# Patient Record
Sex: Female | Born: 1960 | Race: White | Hispanic: No | Marital: Married | Smoking: Never smoker
Health system: Southern US, Community
[De-identification: ages and names within clinical notes are randomized; demographics above are authoritative.]

## PROBLEM LIST (undated history)

## (undated) DIAGNOSIS — C801 Malignant (primary) neoplasm, unspecified: Secondary | ICD-10-CM

## (undated) DIAGNOSIS — R011 Cardiac murmur, unspecified: Secondary | ICD-10-CM

## (undated) HISTORY — PX: BREAST EXCISIONAL BIOPSY: SUR124

## (undated) HISTORY — PX: DENTAL SURGERY: SHX609

## (undated) HISTORY — PX: SKIN CANCER EXCISION: SHX779

---

## 2011-08-30 ENCOUNTER — Other Ambulatory Visit: Payer: Self-pay | Admitting: Family Medicine

## 2011-08-30 DIAGNOSIS — Z1231 Encounter for screening mammogram for malignant neoplasm of breast: Secondary | ICD-10-CM

## 2011-11-17 ENCOUNTER — Ambulatory Visit
Admission: RE | Admit: 2011-11-17 | Discharge: 2011-11-17 | Disposition: A | Discharge status: Home / Self Care | Payer: BC Managed Care – PPO | Source: Ambulatory Visit | Attending: Family Medicine | Admitting: Family Medicine

## 2011-11-17 DIAGNOSIS — Z1231 Encounter for screening mammogram for malignant neoplasm of breast: Secondary | ICD-10-CM

## 2012-11-04 ENCOUNTER — Other Ambulatory Visit: Payer: Self-pay | Admitting: Family Medicine

## 2012-11-04 ENCOUNTER — Other Ambulatory Visit (HOSPITAL_COMMUNITY)
Admission: RE | Admit: 2012-11-04 | Discharge: 2012-11-04 | Disposition: A | Discharge status: Home / Self Care | Payer: BC Managed Care – PPO | Source: Ambulatory Visit | Attending: Family Medicine | Admitting: Family Medicine

## 2012-11-04 DIAGNOSIS — Z124 Encounter for screening for malignant neoplasm of cervix: Secondary | ICD-10-CM | POA: Insufficient documentation

## 2012-11-25 ENCOUNTER — Other Ambulatory Visit: Payer: Self-pay

## 2012-11-25 DIAGNOSIS — Z1231 Encounter for screening mammogram for malignant neoplasm of breast: Secondary | ICD-10-CM

## 2012-12-04 ENCOUNTER — Ambulatory Visit
Admission: RE | Admit: 2012-12-04 | Discharge: 2012-12-04 | Disposition: A | Discharge status: Home / Self Care | Payer: BC Managed Care – PPO | Source: Ambulatory Visit

## 2012-12-04 DIAGNOSIS — Z1231 Encounter for screening mammogram for malignant neoplasm of breast: Secondary | ICD-10-CM

## 2013-11-13 ENCOUNTER — Other Ambulatory Visit: Payer: Self-pay

## 2013-11-13 DIAGNOSIS — Z1231 Encounter for screening mammogram for malignant neoplasm of breast: Secondary | ICD-10-CM

## 2013-12-10 ENCOUNTER — Ambulatory Visit
Admission: RE | Admit: 2013-12-10 | Discharge: 2013-12-10 | Disposition: A | Discharge status: Home / Self Care | Payer: BC Managed Care – PPO | Source: Ambulatory Visit

## 2013-12-10 ENCOUNTER — Encounter (INDEPENDENT_AMBULATORY_CARE_PROVIDER_SITE_OTHER): Payer: Self-pay

## 2013-12-10 DIAGNOSIS — Z1231 Encounter for screening mammogram for malignant neoplasm of breast: Secondary | ICD-10-CM

## 2014-11-24 ENCOUNTER — Other Ambulatory Visit: Payer: Self-pay

## 2014-11-24 DIAGNOSIS — Z1231 Encounter for screening mammogram for malignant neoplasm of breast: Secondary | ICD-10-CM

## 2014-12-23 ENCOUNTER — Ambulatory Visit
Admission: RE | Admit: 2014-12-23 | Discharge: 2014-12-23 | Disposition: A | Discharge status: Home / Self Care | Payer: 59 | Source: Ambulatory Visit

## 2014-12-23 DIAGNOSIS — Z1231 Encounter for screening mammogram for malignant neoplasm of breast: Secondary | ICD-10-CM

## 2015-11-23 ENCOUNTER — Other Ambulatory Visit (HOSPITAL_COMMUNITY)
Admission: RE | Admit: 2015-11-23 | Discharge: 2015-11-23 | Disposition: A | Discharge status: Home / Self Care | Payer: 59 | Source: Ambulatory Visit | Attending: Family Medicine | Admitting: Family Medicine

## 2015-11-23 ENCOUNTER — Other Ambulatory Visit: Payer: Self-pay | Admitting: Family Medicine

## 2015-11-23 DIAGNOSIS — Z1231 Encounter for screening mammogram for malignant neoplasm of breast: Secondary | ICD-10-CM

## 2015-11-23 DIAGNOSIS — Z124 Encounter for screening for malignant neoplasm of cervix: Secondary | ICD-10-CM | POA: Diagnosis present

## 2015-11-25 LAB — CYTOLOGY - PAP

## 2015-12-31 ENCOUNTER — Ambulatory Visit
Admission: RE | Admit: 2015-12-31 | Discharge: 2015-12-31 | Disposition: A | Discharge status: Home / Self Care | Payer: 59 | Source: Ambulatory Visit | Attending: Family Medicine | Admitting: Family Medicine

## 2015-12-31 DIAGNOSIS — Z1231 Encounter for screening mammogram for malignant neoplasm of breast: Secondary | ICD-10-CM

## 2016-09-21 ENCOUNTER — Encounter (HOSPITAL_COMMUNITY): Payer: Self-pay | Admitting: Emergency Medicine

## 2016-09-21 ENCOUNTER — Emergency Department (HOSPITAL_COMMUNITY)
Admission: EM | Admit: 2016-09-21 | Discharge: 2016-09-22 | Disposition: A | Discharge status: Home / Self Care | Payer: 59 | Attending: Emergency Medicine | Admitting: Emergency Medicine

## 2016-09-21 DIAGNOSIS — K8020 Calculus of gallbladder without cholecystitis without obstruction: Secondary | ICD-10-CM | POA: Diagnosis not present

## 2016-09-21 DIAGNOSIS — R079 Chest pain, unspecified: Secondary | ICD-10-CM | POA: Diagnosis present

## 2016-09-21 NOTE — ED Triage Notes (Signed)
Per EMS, pt began having chest pressure at 0130 on 7/4. Pain non-radiating, pt c/o n/v, shortness of breath. Pain subsided yesterday and began again today at 1700. Pt given 324 aspirin en route, and 1 NTG, without relief. BP dropped from 130 SBP to 94 SBP after NTG, given 250 mL NS. EMS vitals: BP-107/80, SpO2-99% room air, P-90

## 2016-09-22 ENCOUNTER — Emergency Department (HOSPITAL_COMMUNITY): Payer: 59

## 2016-09-22 LAB — HEPATIC FUNCTION PANEL
ALBUMIN: 4.1 g/dL (ref 3.5–5.0)
ALT: 377 U/L — ABNORMAL HIGH (ref 14–54)
AST: 387 U/L — AB (ref 15–41)
Alkaline Phosphatase: 64 U/L (ref 38–126)
Bilirubin, Direct: 0.6 mg/dL — ABNORMAL HIGH (ref 0.1–0.5)
Indirect Bilirubin: 0.7 mg/dL (ref 0.3–0.9)
TOTAL PROTEIN: 6.6 g/dL (ref 6.5–8.1)
Total Bilirubin: 1.3 mg/dL — ABNORMAL HIGH (ref 0.3–1.2)

## 2016-09-22 LAB — BASIC METABOLIC PANEL
ANION GAP: 6 (ref 5–15)
BUN: 12 mg/dL (ref 6–20)
CALCIUM: 9.5 mg/dL (ref 8.9–10.3)
CO2: 26 mmol/L (ref 22–32)
Chloride: 106 mmol/L (ref 101–111)
Creatinine, Ser: 0.84 mg/dL (ref 0.44–1.00)
GLUCOSE: 118 mg/dL — AB (ref 65–99)
POTASSIUM: 4 mmol/L (ref 3.5–5.1)
Sodium: 138 mmol/L (ref 135–145)

## 2016-09-22 LAB — CBC
HEMATOCRIT: 39.9 % (ref 36.0–46.0)
HEMOGLOBIN: 12.6 g/dL (ref 12.0–15.0)
MCH: 29.2 pg (ref 26.0–34.0)
MCHC: 31.6 g/dL (ref 30.0–36.0)
MCV: 92.4 fL (ref 78.0–100.0)
Platelets: 190 10*3/uL (ref 150–400)
RBC: 4.32 MIL/uL (ref 3.87–5.11)
RDW: 12.7 % (ref 11.5–15.5)
WBC: 8.7 10*3/uL (ref 4.0–10.5)

## 2016-09-22 LAB — LIPASE, BLOOD: Lipase: 31 U/L (ref 11–51)

## 2016-09-22 LAB — I-STAT TROPONIN, ED: TROPONIN I, POC: 0 ng/mL (ref 0.00–0.08)

## 2016-09-22 MED ORDER — ONDANSETRON 4 MG PO TBDP: 4.0000 mg | ORAL_TABLET | Freq: Three times a day (TID) | ORAL | 0 refills | Status: AC | PRN

## 2016-09-22 MED ORDER — HYOSCYAMINE SULFATE 0.5 MG/ML IJ SOLN
0.1250 mg | Freq: Once | INTRAMUSCULAR | Status: AC
  Administered 2016-09-22: 0.125 mg via INTRAVENOUS
  Filled 2016-09-22: qty 0.25

## 2016-09-22 MED ORDER — OMEPRAZOLE 20 MG PO CPDR: 20.0000 mg | DELAYED_RELEASE_CAPSULE | Freq: Every day | ORAL | 0 refills | Status: AC

## 2016-09-22 MED ORDER — HYDROCODONE-ACETAMINOPHEN 5-325 MG PO TABS: 1.0000 | ORAL_TABLET | ORAL | 0 refills | Status: AC | PRN

## 2016-09-22 MED ORDER — ONDANSETRON HCL 4 MG/2ML IJ SOLN
4.0000 mg | Freq: Once | INTRAMUSCULAR | Status: AC
  Administered 2016-09-22: 4 mg via INTRAVENOUS
  Filled 2016-09-22: qty 2

## 2016-09-22 MED ORDER — MORPHINE SULFATE (PF) 4 MG/ML IV SOLN
4.0000 mg | Freq: Once | INTRAVENOUS | Status: AC
  Administered 2016-09-22: 4 mg via INTRAVENOUS
  Filled 2016-09-22: qty 1

## 2016-09-22 NOTE — ED Notes (Signed)
Patient transported to Ultrasound 

## 2016-09-22 NOTE — ED Notes (Signed)
Lab notified on pt.'s pending Lipase and LFT orders .

## 2016-09-22 NOTE — ED Notes (Signed)
Pt transported to US

## 2016-09-22 NOTE — Discharge Instructions (Signed)
Follow up with Dr. Grandville Silos with [facility], for further treatment of gall stones. Take medications as prescribed. Return here with any severe pain, uncontrolled vomiting, high fever or new concern.

## 2016-09-22 NOTE — ED Provider Notes (Signed)
[place] DEPT Provider Note   CSN: [id number] Arrival date & time: 09/21/16  2349     History   Chief Complaint Chief Complaint  Patient presents with  . Chest Pain    HPI Karina Dean is a 56 y.o. female.  Patient without significant medical history presents with complaint of chest pain described as sharp, and that woke her from sleep initially on 09/20/16. Pain subsided and then returned 09/21/16 in the afternoon. She was given NTG and aspirin by EMS and reports no relief with these medications. She has had nausea and vomiting. No diarrhea, SOB or diaphoresis. The pain radiates into the back. She cannot identify any modifying factors.    The history is provided by the patient. No language interpreter was used.  Chest Pain   Pertinent negatives include no abdominal pain, no diaphoresis, no fever, no nausea, no shortness of breath and no vomiting.    History reviewed. No pertinent past medical history.  There are no active problems to display for this patient.   History reviewed. No pertinent surgical history.  OB History    No data available       Home Medications    Prior to Admission medications   Medication Sig Start Date End Date Taking? Authorizing Provider  ibuprofen (ADVIL,MOTRIN) 200 MG tablet Take 400 mg by mouth every 6 (six) hours as needed for moderate pain.   Yes [provider]  Multiple Vitamin (MULTIVITAMIN WITH MINERALS) TABS tablet Take 2 tablets by mouth daily. gummy's   Yes [provider]    Family History No family history on file.  Social History Social History  Substance Use Topics  . Smoking status: Never Smoker  . Smokeless tobacco: Never Used  . Alcohol use No     Allergies   Patient has no known allergies.   Review of Systems Review of Systems  Constitutional: Negative for chills, diaphoresis and fever.  HENT: Negative.   Respiratory: Negative.  Negative for shortness of breath.   Cardiovascular:  Positive for chest pain. Negative for leg swelling.  Gastrointestinal: Negative.  Negative for abdominal pain, nausea and vomiting.  Genitourinary: Negative.   Musculoskeletal: Negative.   Skin: Negative.   Neurological: Negative.      Physical Exam Updated Vital Signs BP 97/69   Pulse 67   Temp 98 F (36.7 C) (Oral)   Resp 16   Ht 5\' 10"  (1.778 m)   Wt 83.9 kg (185 lb)   LMP 08/19/2012   SpO2 97%   BMI 26.54 kg/m   Physical Exam  Constitutional: She is oriented to person, place, and time. She appears well-developed and well-nourished.  HENT:  Head: Normocephalic.  Neck: Normal range of motion. Neck supple.  Cardiovascular: Normal rate and regular rhythm.   Pulmonary/Chest: Effort normal. She has no wheezes. She has no rales. She exhibits no tenderness.  Abdominal: Soft. Bowel sounds are normal. There is tenderness (Epigastric and RUQ tenderness. No guarding.). There is no rebound and no guarding.  Musculoskeletal: Normal range of motion. She exhibits no edema.  Neurological: She is alert and oriented to person, place, and time.  Skin: Skin is warm and dry. No rash noted.  Psychiatric: She has a normal mood and affect.     ED Treatments / Results  Labs (all labs ordered are listed, but only abnormal results are displayed) Labs Reviewed  BASIC METABOLIC PANEL - Abnormal; Notable for the following:       Result Value   Glucose,  Bld 118 (*)    All other components within normal limits  HEPATIC FUNCTION PANEL - Abnormal; Notable for the following:    AST 387 (*)    ALT 377 (*)    Total Bilirubin 1.3 (*)    Bilirubin, Direct 0.6 (*)    All other components within normal limits  CBC  LIPASE, BLOOD  I-STAT TROPOININ, ED    EKG  EKG Interpretation  Date/Time:  Thursday September 21 2016 23:54:50 EDT Ventricular Rate:  51 PR Interval:    QRS Duration: 80 QT Interval:  448 QTC Calculation: 413 R Axis:   71 Text Interpretation:  Sinus arrhythmia Early repolarization  No previous tracing Confirmed by Orpah Greek [phone]) on 09/22/2016 1:13:31 AM       Radiology Dg Chest 2 View  Result Date: 09/22/2016 CLINICAL DATA:  Chest pain and shortness of breath EXAM: CHEST  2 VIEW COMPARISON:  None. FINDINGS: The heart size and mediastinal contours are within normal limits. Both lungs are clear. The visualized skeletal structures are unremarkable. IMPRESSION: No active cardiopulmonary disease. Electronically Signed   By: Ulyses Jarred M.D.   On: 09/22/2016 01:31   US Abdomen Limited  Result Date: 09/22/2016 CLINICAL DATA:  Acute onset of elevated LFTs and epigastric abdominal pain. Initial encounter. EXAM: ULTRASOUND ABDOMEN LIMITED RIGHT UPPER QUADRANT COMPARISON:  None. FINDINGS: Gallbladder: Stones within the gallbladder measure up to 3 mm in size. No gallbladder wall thickening or pericholecystic fluid is seen. No ultrasonographic Murphy's sign is elicited. Common bile duct: Diameter: 0.4 cm, within normal limits in caliber. Liver: A small 0.8 cm cyst is noted at the right hepatic lobe. Within normal limits in parenchymal echogenicity. IMPRESSION: 1. No acute abnormality seen at the right upper quadrant. 2. Cholelithiasis.  Gallbladder otherwise unremarkable. 3. Small 0.8 cm hepatic cyst noted. Electronically Signed   By: Garald Balding M.D.   On: 09/22/2016 03:45    Procedures Procedures (including critical care time)  Medications Ordered in ED Medications  morphine 4 MG/ML injection 4 mg (4 mg Intravenous Given 09/22/16 0022)  ondansetron (ZOFRAN) injection 4 mg (4 mg Intravenous Given 09/22/16 0022)  hyoscyamine (LEVSIN) 0.5 MG/ML injection 0.125 mg (0.125 mg Intravenous Given 09/22/16 0034)     Initial Impression / Assessment and Plan / ED Course  I have reviewed the triage vital signs and the nursing notes.  Pertinent labs & imaging results that were available during my care of the patient were reviewed by me and considered in my medical decision  making (see chart for details).     Patient presents for evaluation of chest pain described as sharp, radiating to back. She has had nausea and vomiting. She states the pain has been severe.   Pain is atypical for cardiac pain. No better with NTG. Morphine and Levsin provided IV and patient is now painfree.   She has elevated LFT's on lab studies. [place] RUQ ordered and shows gall stones without evidence of cholecystitis. She is seen by Dr. Betsey Holiday who has discussed the patient with surgery. Advised she can be discharged home with office follow up for elective cholecystectomy.  Final Clinical Impressions(s) / ED Diagnoses   Final diagnoses:  None   1. Cholelithiasis 2. Elevated LFTs  New Prescriptions New Prescriptions   No medications on file     Charlann Lange, Hershal Coria 10/02/16 0112    Orpah Greek, MD 10/02/16 [phone]

## 2016-09-22 NOTE — ED Notes (Signed)
Dr. Betsey Holiday explained tests results and plan of care to pt.

## 2016-09-22 NOTE — ED Provider Notes (Signed)
Patient presented to the ER with chest pain. Patient had an episode yesterday and then again tonight. Pain radiating into back. Pain associated with nausea and vomiting.  Face to face Exam: HEENT - PERRLA Lungs - CTAB Heart - RRR, no M/R/G Abd - S/NT/ND Neuro - alert, oriented x3  Plan: Cardiac evaluation negative, symptoms very atypical for cardiac etiology. Gallstone without cholecystitis on ultrasound. Discussed with Dr. Grandville Silos, general surgery. As patient is now pain-free and has no tenderness on exam, Follow-Up As an Outpatient.   Orpah Greek, MD 09/22/16 [phone]

## 2016-09-27 ENCOUNTER — Ambulatory Visit: Payer: Self-pay | Admitting: General Surgery

## 2016-09-28 NOTE — Pre-Procedure Instructions (Signed)
Karina Dean  09/28/2016      [id number] - [place], [place] - [address] [place] [address] Phone: [phone] Fax: [phone]    Your procedure is scheduled on October 05, 2016.  Report to [facility] Admitting at 720 AM  Call this number if you have problems the morning of surgery:  [phone]   Remember:  Do not eat food or drink liquids after midnight.  Take these medicines the morning of surgery with A SIP OF WATER hydrocodone -if needed for pain, omeprazole (prilosec), zofran-if needed for nausea.  7 days prior to surgery STOP taking any Aspirin, Aleve, Naproxen, Ibuprofen, Motrin, Advil, Goody's, BC's, all herbal medications, fish oil, and all vitamins   Do not wear jewelry, make-up or nail polish.  Do not wear lotions, powders, or perfumes, or deoderant.  Do not shave 48 hours prior to surgery.    Do not bring valuables to the hospital.  [facility] is not responsible for any belongings or valuables.  Contacts, dentures or bridgework may not be worn into surgery.  Leave your suitcase in the car.  After surgery it may be brought to your room.  For patients admitted to the hospital, discharge time will be determined by your treatment team.  Patients discharged the day of surgery will not be allowed to drive home.   Special instructions:   [facility]- Preparing For Surgery  Before surgery, you can play an important role. Because skin is not sterile, your skin needs to be as free of germs as possible. You can reduce the number of germs on your skin by washing with CHG (chlorahexidine gluconate) Soap before surgery.  CHG is an antiseptic cleaner which kills germs and bonds with the skin to continue killing germs even after washing.  Please do not use if you have an allergy to CHG or antibacterial soaps. If your skin becomes reddened/irritated stop using the CHG.  Do not shave  (including legs and underarms) for at least 48 hours prior to first CHG shower. It is OK to shave your face.  Please follow these instructions carefully.   1. Shower the NIGHT BEFORE SURGERY and the MORNING OF SURGERY with CHG.   2. If you chose to wash your hair, wash your hair first as usual with your normal shampoo.  3. After you shampoo, rinse your hair and body thoroughly to remove the shampoo.  4. Use CHG as you would any other liquid soap. You can apply CHG directly to the skin and wash gently with a scrungie or a clean washcloth.   5. Apply the CHG Soap to your body ONLY FROM THE NECK DOWN.  Do not use on open wounds or open sores. Avoid contact with your eyes, ears, mouth and genitals (private parts). Wash genitals (private parts) with your normal soap.  6. Wash thoroughly, paying special attention to the area where your surgery will be performed.  7. Thoroughly rinse your body with warm water from the neck down.  8. DO NOT shower/wash with your normal soap after using and rinsing off the CHG Soap.  9. Pat yourself dry with a CLEAN TOWEL.   10. Wear CLEAN PAJAMAS   11. Place CLEAN SHEETS on your bed the night of your first shower and DO NOT SLEEP WITH PETS.    Day of Surgery: Do not apply any deodorants/lotions. Please wear clean clothes to the hospital/surgery center.  Please read over the following fact sheets that you were given. Pain Booklet, Coughing and Deep Breathing and Surgical Site Infection Prevention

## 2016-09-29 ENCOUNTER — Encounter (HOSPITAL_COMMUNITY)
Admission: RE | Admit: 2016-09-29 | Discharge: 2016-09-29 | Disposition: A | Discharge status: Home / Self Care | Payer: 59 | Source: Ambulatory Visit | Attending: General Surgery | Admitting: General Surgery

## 2016-09-29 ENCOUNTER — Encounter (HOSPITAL_COMMUNITY): Payer: Self-pay

## 2016-09-29 DIAGNOSIS — Z01818 Encounter for other preprocedural examination: Secondary | ICD-10-CM | POA: Insufficient documentation

## 2016-09-29 DIAGNOSIS — K8080 Other cholelithiasis without obstruction: Secondary | ICD-10-CM | POA: Insufficient documentation

## 2016-09-29 HISTORY — DX: Malignant (primary) neoplasm, unspecified: C80.1

## 2016-09-29 HISTORY — DX: Cardiac murmur, unspecified: R01.1

## 2016-09-29 LAB — CBC
HCT: 41.5 % (ref 36.0–46.0)
Hemoglobin: 13.7 g/dL (ref 12.0–15.0)
MCH: 30.2 pg (ref 26.0–34.0)
MCHC: 33 g/dL (ref 30.0–36.0)
MCV: 91.4 fL (ref 78.0–100.0)
PLATELETS: 214 10*3/uL (ref 150–400)
RBC: 4.54 MIL/uL (ref 3.87–5.11)
RDW: 12.1 % (ref 11.5–15.5)
WBC: 4.6 10*3/uL (ref 4.0–10.5)

## 2016-10-02 ENCOUNTER — Other Ambulatory Visit (HOSPITAL_COMMUNITY): Payer: Self-pay

## 2016-10-05 ENCOUNTER — Ambulatory Visit (HOSPITAL_COMMUNITY): Payer: 59 | Admitting: Certified Registered"

## 2016-10-05 ENCOUNTER — Ambulatory Visit (HOSPITAL_COMMUNITY)
Admission: RE | Admit: 2016-10-05 | Discharge: 2016-10-05 | Disposition: A | Discharge status: Home / Self Care | Payer: 59 | Source: Ambulatory Visit | Attending: General Surgery | Admitting: General Surgery

## 2016-10-05 ENCOUNTER — Encounter (HOSPITAL_COMMUNITY)
Admission: RE | Disposition: A | Discharge status: Home / Self Care | Payer: Self-pay | Source: Ambulatory Visit | Attending: General Surgery

## 2016-10-05 ENCOUNTER — Encounter (HOSPITAL_COMMUNITY): Payer: Self-pay | Admitting: *Deleted

## 2016-10-05 DIAGNOSIS — Z811 Family history of alcohol abuse and dependence: Secondary | ICD-10-CM | POA: Insufficient documentation

## 2016-10-05 DIAGNOSIS — R011 Cardiac murmur, unspecified: Secondary | ICD-10-CM | POA: Insufficient documentation

## 2016-10-05 DIAGNOSIS — Z86010 Personal history of colonic polyps: Secondary | ICD-10-CM | POA: Insufficient documentation

## 2016-10-05 DIAGNOSIS — Z8371 Family history of colonic polyps: Secondary | ICD-10-CM | POA: Diagnosis not present

## 2016-10-05 DIAGNOSIS — K8010 Calculus of gallbladder with chronic cholecystitis without obstruction: Secondary | ICD-10-CM | POA: Insufficient documentation

## 2016-10-05 DIAGNOSIS — M549 Dorsalgia, unspecified: Secondary | ICD-10-CM | POA: Insufficient documentation

## 2016-10-05 DIAGNOSIS — Z823 Family history of stroke: Secondary | ICD-10-CM | POA: Insufficient documentation

## 2016-10-05 DIAGNOSIS — Z85820 Personal history of malignant melanoma of skin: Secondary | ICD-10-CM | POA: Insufficient documentation

## 2016-10-05 DIAGNOSIS — K219 Gastro-esophageal reflux disease without esophagitis: Secondary | ICD-10-CM | POA: Insufficient documentation

## 2016-10-05 HISTORY — PX: CHOLECYSTECTOMY: SHX55

## 2016-10-05 SURGERY — LAPAROSCOPIC CHOLECYSTECTOMY
Anesthesia: General | Site: Abdomen

## 2016-10-05 MED ORDER — LIDOCAINE HCL (CARDIAC) 20 MG/ML IV SOLN
INTRAVENOUS | Status: DC | PRN
  Administered 2016-10-05: 80 mg via INTRAVENOUS

## 2016-10-05 MED ORDER — FENTANYL CITRATE (PF) 100 MCG/2ML IJ SOLN
INTRAMUSCULAR | Status: DC | PRN
Start: 2016-10-05 — End: 2016-10-05
  Administered 2016-10-05 (×2): 100 ug via INTRAVENOUS
  Administered 2016-10-05: 50 ug via INTRAVENOUS

## 2016-10-05 MED ORDER — MIDAZOLAM HCL 2 MG/2ML IJ SOLN
INTRAMUSCULAR | Status: AC
  Filled 2016-10-05: qty 2

## 2016-10-05 MED ORDER — ONDANSETRON HCL 4 MG/2ML IJ SOLN
INTRAMUSCULAR | Status: DC | PRN
  Administered 2016-10-05: 4 mg via INTRAVENOUS

## 2016-10-05 MED ORDER — OXYCODONE-ACETAMINOPHEN 5-325 MG PO TABS: 1.0000 | ORAL_TABLET | Freq: Four times a day (QID) | ORAL | 0 refills | Status: AC | PRN

## 2016-10-05 MED ORDER — HYDROMORPHONE HCL 1 MG/ML IJ SOLN
0.2500 mg | INTRAMUSCULAR | Status: DC | PRN
  Administered 2016-10-05 (×2): 0.5 mg via INTRAVENOUS

## 2016-10-05 MED ORDER — CEFAZOLIN SODIUM-DEXTROSE 2-4 GM/100ML-% IV SOLN
2.0000 g | INTRAVENOUS | Status: AC
  Administered 2016-10-05: 2 g via INTRAVENOUS
  Filled 2016-10-05: qty 100

## 2016-10-05 MED ORDER — HYDROMORPHONE HCL 1 MG/ML IJ SOLN
INTRAMUSCULAR | Status: AC
  Filled 2016-10-05: qty 0.5

## 2016-10-05 MED ORDER — SUGAMMADEX SODIUM 200 MG/2ML IV SOLN
INTRAVENOUS | Status: AC
  Filled 2016-10-05: qty 2

## 2016-10-05 MED ORDER — DEXAMETHASONE SODIUM PHOSPHATE 10 MG/ML IJ SOLN
INTRAMUSCULAR | Status: DC | PRN
  Administered 2016-10-05: 10 mg via INTRAVENOUS

## 2016-10-05 MED ORDER — BUPIVACAINE HCL (PF) 0.25 % IJ SOLN
INTRAMUSCULAR | Status: AC
  Filled 2016-10-05: qty 30

## 2016-10-05 MED ORDER — PROPOFOL 10 MG/ML IV BOLUS
INTRAVENOUS | Status: DC | PRN
  Administered 2016-10-05: 160 mg via INTRAVENOUS

## 2016-10-05 MED ORDER — ROCURONIUM BROMIDE 100 MG/10ML IV SOLN
INTRAVENOUS | Status: DC | PRN
  Administered 2016-10-05: 30 mg via INTRAVENOUS

## 2016-10-05 MED ORDER — 0.9 % SODIUM CHLORIDE (POUR BTL) OPTIME
TOPICAL | Status: DC | PRN
  Administered 2016-10-05: 1000 mL

## 2016-10-05 MED ORDER — ONDANSETRON HCL 4 MG/2ML IJ SOLN
INTRAMUSCULAR | Status: AC
  Filled 2016-10-05: qty 2

## 2016-10-05 MED ORDER — OXYCODONE-ACETAMINOPHEN 5-325 MG PO TABS
ORAL_TABLET | ORAL | Status: AC
  Administered 2016-10-05: 1
  Filled 2016-10-05: qty 1

## 2016-10-05 MED ORDER — MIDAZOLAM HCL 5 MG/5ML IJ SOLN
INTRAMUSCULAR | Status: DC | PRN
  Administered 2016-10-05: 2 mg via INTRAVENOUS

## 2016-10-05 MED ORDER — PROPOFOL 10 MG/ML IV BOLUS
INTRAVENOUS | Status: AC
  Filled 2016-10-05: qty 20

## 2016-10-05 MED ORDER — CHLORHEXIDINE GLUCONATE CLOTH 2 % EX PADS: 6.0000 | MEDICATED_PAD | Freq: Once | CUTANEOUS | Status: DC

## 2016-10-05 MED ORDER — BUPIVACAINE HCL 0.25 % IJ SOLN
INTRAMUSCULAR | Status: DC | PRN
  Administered 2016-10-05: 5 mL

## 2016-10-05 MED ORDER — FENTANYL CITRATE (PF) 250 MCG/5ML IJ SOLN
INTRAMUSCULAR | Status: AC
  Filled 2016-10-05: qty 5

## 2016-10-05 MED ORDER — DEXAMETHASONE SODIUM PHOSPHATE 10 MG/ML IJ SOLN
INTRAMUSCULAR | Status: AC
  Filled 2016-10-05: qty 1

## 2016-10-05 MED ORDER — LACTATED RINGERS IV SOLN
INTRAVENOUS | Status: DC
  Administered 2016-10-05 (×2): via INTRAVENOUS

## 2016-10-05 MED ORDER — DEXMEDETOMIDINE HCL IN NACL 200 MCG/50ML IV SOLN
INTRAVENOUS | Status: AC
  Filled 2016-10-05: qty 50

## 2016-10-05 MED ORDER — SODIUM CHLORIDE 0.9 % IR SOLN
Status: DC | PRN
  Administered 2016-10-05: 1000 mL

## 2016-10-05 MED ORDER — SUGAMMADEX SODIUM 200 MG/2ML IV SOLN
INTRAVENOUS | Status: DC | PRN
  Administered 2016-10-05: 160 mg via INTRAVENOUS

## 2016-10-05 SURGICAL SUPPLY — 43 items
BENZOIN TINCTURE PRP APPL 2/3 (GAUZE/BANDAGES/DRESSINGS) ×3 IMPLANT
CANISTER SUCT 3000ML PPV (MISCELLANEOUS) ×3 IMPLANT
CHLORAPREP W/TINT 26ML (MISCELLANEOUS) ×3 IMPLANT
CLIP VESOLOCK MED LG 6/CT (CLIP) ×3 IMPLANT
CLOSURE WOUND 1/2 X4 (GAUZE/BANDAGES/DRESSINGS) ×1
COVER SURGICAL LIGHT HANDLE (MISCELLANEOUS) ×3 IMPLANT
COVER TRANSDUCER ULTRASND (DRAPES) ×3 IMPLANT
DEVICE PMI PUNCTURE CLOSURE (MISCELLANEOUS) ×3 IMPLANT
ELECT REM PT RETURN 9FT ADLT (ELECTROSURGICAL) ×3
ELECTRODE REM PT RTRN 9FT ADLT (ELECTROSURGICAL) ×1 IMPLANT
GAUZE SPONGE 2X2 8PLY STRL LF (GAUZE/BANDAGES/DRESSINGS) ×1 IMPLANT
GLOVE BIO SURGEON STRL SZ 6.5 (GLOVE) ×2 IMPLANT
GLOVE BIO SURGEON STRL SZ7.5 (GLOVE) ×3 IMPLANT
GLOVE BIO SURGEONS STRL SZ 6.5 (GLOVE) ×1
GLOVE BIOGEL PI IND STRL 6.5 (GLOVE) ×2 IMPLANT
GLOVE BIOGEL PI INDICATOR 6.5 (GLOVE) ×4
GLOVE ECLIPSE 6.0 STRL STRAW (GLOVE) ×3 IMPLANT
GLOVE SURG SS PI 6.5 STRL IVOR (GLOVE) ×3 IMPLANT
GOWN STRL REUS W/ TWL LRG LVL3 (GOWN DISPOSABLE) ×2 IMPLANT
GOWN STRL REUS W/ TWL XL LVL3 (GOWN DISPOSABLE) ×1 IMPLANT
GOWN STRL REUS W/TWL LRG LVL3 (GOWN DISPOSABLE) ×4
GOWN STRL REUS W/TWL XL LVL3 (GOWN DISPOSABLE) ×2
GRASPER SUT TROCAR 14GX15 (MISCELLANEOUS) ×3 IMPLANT
KIT BASIN OR (CUSTOM PROCEDURE TRAY) ×3 IMPLANT
KIT ROOM TURNOVER OR (KITS) ×3 IMPLANT
NEEDLE INSUFFLATION 14GA 120MM (NEEDLE) ×3 IMPLANT
NS IRRIG 1000ML POUR BTL (IV SOLUTION) ×3 IMPLANT
PAD ARMBOARD 7.5X6 YLW CONV (MISCELLANEOUS) ×6 IMPLANT
POUCH RETRIEVAL ECOSAC 10 (ENDOMECHANICALS) IMPLANT
POUCH RETRIEVAL ECOSAC 10MM (ENDOMECHANICALS)
SCISSORS LAP 5X35 DISP (ENDOMECHANICALS) ×3 IMPLANT
SET IRRIG TUBING LAPAROSCOPIC (IRRIGATION / IRRIGATOR) ×3 IMPLANT
SLEEVE ENDOPATH XCEL 5M (ENDOMECHANICALS) ×3 IMPLANT
SPECIMEN JAR SMALL (MISCELLANEOUS) ×3 IMPLANT
SPONGE GAUZE 2X2 STER 10/PKG (GAUZE/BANDAGES/DRESSINGS) ×2
STRIP CLOSURE SKIN 1/2X4 (GAUZE/BANDAGES/DRESSINGS) ×2 IMPLANT
SUT MNCRL AB 3-0 PS2 18 (SUTURE) ×3 IMPLANT
TOWEL OR 17X24 6PK STRL BLUE (TOWEL DISPOSABLE) ×3 IMPLANT
TOWEL OR 17X26 10 PK STRL BLUE (TOWEL DISPOSABLE) ×3 IMPLANT
TRAY LAPAROSCOPIC MC (CUSTOM PROCEDURE TRAY) ×3 IMPLANT
TROCAR XCEL NON-BLD 11X100MML (ENDOMECHANICALS) ×3 IMPLANT
TROCAR XCEL NON-BLD 5MMX100MML (ENDOMECHANICALS) ×3 IMPLANT
TUBING INSUFFLATION (TUBING) ×3 IMPLANT

## 2016-10-05 NOTE — H&P (Signed)
History of Present Illness Ralene Ok MD; 09/26/2016 10:09 AM) The patient is a 56 year old female who presents for evaluation of gall stones. Referred by: Dr. Betsey Holiday Chief Complaint: Gallstones  Patient is a 56 year old female with approximately 3-4 week history of abdominal pain which he states is epigastric/right upper quadrant. States that the pain was on 2 episodes at 1 AM in the morning. She states this was associated with nausea and vomiting. She states that it was sharp in severity. She states that this was after eating a high fatty meal the night previous. She states that since that time she try to stay on a liquid diet, bland diet. Patient's only modifying factors or staying away from a regular diet to help with instigating the pain.  Patient recently went to the ER secondary to continued abdominal pain. Patient underwent an ultrasound which I reviewed which revealed small gallstones. Patient's LFTs will elevated, with a T bili 1.3.    Past Surgical History Erline Levine, RN; 09/26/2016 9:51 AM) Cesarean Section - Multiple  Colon Polyp Removal - Colonoscopy   Diagnostic Studies History Erline Levine, RN; 09/26/2016 9:50 AM) Colonoscopy  1-5 years ago Mammogram  within last year  Social History Erline Levine, RN; 09/26/2016 9:51 AM) Alcohol use  Occasional alcohol use. No drug use  Tobacco use  Never smoker.  Family History Erline Levine, RN; 09/26/2016 9:51 AM) Alcohol Abuse  Father. Cerebrovascular Accident  Father. Colon Polyps  Father.  Pregnancy / Birth History Erline Levine, RN; 09/26/2016 9:51 AM) Age at menarche  14 years. Age of menopause  21-50 Gravida  2 Length (months) of breastfeeding  3-6 Maternal age  43-35 Para  2  Other Problems Erline Levine, RN; 09/26/2016 9:51 AM) Back Pain  Cholelithiasis  Gastroesophageal Reflux Disease  Heart murmur  Melanoma     Review of Systems Ralene Ok MD; 09/26/2016 10:06 AM) General  Present- Appetite Loss and Fatigue. Not Present- Chills, Fever, Night Sweats, Weight Gain and Weight Loss. Skin Not Present- Change in Wart/Mole, Dryness, Hives, Jaundice, New Lesions, Non-Healing Wounds, Rash and Ulcer. HEENT Not Present- Earache, Hearing Loss, Hoarseness, Nose Bleed, Oral Ulcers, Ringing in the Ears, Seasonal Allergies, Sinus Pain, Sore Throat, Visual Disturbances, Wears glasses/contact lenses and Yellow Eyes. Respiratory Not Present- Bloody sputum, Chronic Cough, Difficulty Breathing, Snoring and Wheezing. Cardiovascular Not Present- Chest Pain. Gastrointestinal Present- Abdominal Pain, Bloating, Excessive gas, Gets full quickly at meals and Indigestion. Not Present- Bloody Stool, Change in Bowel Habits, Chronic diarrhea, Constipation, Difficulty Swallowing, Hemorrhoids, Nausea, Rectal Pain and Vomiting. Female Genitourinary Not Present- Frequency, Nocturia, Painful Urination, Pelvic Pain and Urgency. Musculoskeletal Present- Back Pain. Not Present- Joint Pain, Joint Stiffness, Muscle Pain, Muscle Weakness and Swelling of Extremities. Neurological Not Present- Decreased Memory, Fainting, Headaches, Numbness, Seizures, Tingling, Tremor, Trouble walking and Weakness. Psychiatric Not Present- Anxiety, Bipolar, Change in Sleep Pattern, Depression, Fearful and Frequent crying. Endocrine Present- Hot flashes. Not Present- Cold Intolerance, Excessive Hunger, Hair Changes, Heat Intolerance and New Diabetes. Hematology Not Present- Blood Thinners, Easy Bruising, Excessive bleeding, Gland problems, HIV and Persistent Infections. All other systems negative  BP 95/70   Pulse 62   Temp 98.6 F (37 C) (Oral)   Resp 18   Ht 5\' 10"  (1.778 m)   Wt 89.4 kg (197 lb)   LMP 08/19/2012   SpO2 99%   BMI 28.27 kg/m   Physical Exam Ralene Ok MD; 09/26/2016 10:10 AM) The physical exam findings are as follows: Note:Constitutional: No acute distress, conversant,  appears stated  age  Eyes: Anicteric sclerae, moist conjunctiva, no lid lag  Neck: No thyromegaly, trachea midline, no cervical lymphadenopathy  Lungs: Clear to auscultation biilaterally, normal respiratory effot  Cardiovascular: regular rate & rhythm, no murmurs, no peripheal edema, pedal pulses 2+  GI: Soft, no masses or hepatosplenomegaly, non-tender to palpation  MSK: Normal gait, no clubbing cyanosis, edema  Skin: No rashes, palpation reveals normal skin turgor  Psychiatric: Appropriate judgment and insight, oriented to person, place, and time    Assessment & Plan Ralene Ok MD; 09/26/2016 10:10 AM) SYMPTOMATIC CHOLELITHIASIS (K80.20) Impression: 56 year old female with symptomatically cholelithiasis  1. We will proceed to the operating room for a laparoscopic cholecystectomy  2. Risks and benefits were discussed with the patient to generally include, but not limited to: infection, bleeding, possible need for post op ERCP, damage to the bile ducts, bile leak, and possible need for further surgery. Alternatives were offered and described. All questions were answered and the patient voiced understanding of the procedure and wishes to proceed at this point with a laparoscopic cholecystectomy

## 2016-10-05 NOTE — Op Note (Signed)
10/05/2016  10:43 AM  PATIENT:  Michael Litter  56 y.o. female  PRE-OPERATIVE DIAGNOSIS:  CHOLELITHIASIS  POST-OPERATIVE DIAGNOSIS:  CHOLELITHIASIS  PROCEDURE:  Procedure(s): LAPAROSCOPIC CHOLECYSTECTOMY (N/A)  SURGEON:  Surgeon(s) and Role:    * Ralene Ok, MD - Primary  ANESTHESIA:   local  EBL:  Total I/O In: 1000 [I.V.:1000] Out: 20 [Blood:20]  BLOOD ADMINISTERED:none  DRAINS: none   LOCAL MEDICATIONS USED:  BUPIVICAINE   SPECIMEN:  Source of Specimen:  gallbladder  DISPOSITION OF SPECIMEN:  PATHOLOGY  COUNTS:  YES  TOURNIQUET:  * No tourniquets in log *  DICTATION: .Dragon Dictation  EBL: <5DG   Complications: none   Counts: reported as correct x 2   Findings:chronic inflammation of gallbladder  Indications for procedure: Pt is a 56 y/o F with RUQ pain  Details of the procedure:   The patient was taken to the operating and placed in the supine position with bilateral SCDs in place. A time out was called and all facts were verified. A pneumoperitoneum was obtained via A Veress needle technique to a pressure of 40mm of mercury. A 62mm trochar was then placed in the right upper quadrant under visualization, and there were no injuries to any abdominal organs. A 11 mm port was then placed in the umbilical region after infiltrating with local anesthesia under direct visualization. A second epigastric port was placed under direct visualization.   The gallbladder was identified and retracted, the peritoneum was then sharply dissected from the gallbladder and this dissection was carried down to Calot's triangle. The cystic duct was identified and dissected circumferentially and seen going into the gallbladder 360.  The cystic artery was dissected away from the surrounding tissues.   The critical angle was obtained.   2 clips were placed proximally one distally and the cystic duct transected. The cystic artery was identified and 2 clips placed proximally and one  distally and transected. We then proceeded to remove the gallbladder off the hepatic fossa with Bovie cautery. A retrieval bag was then placed in the abdomen and gallbladder placed in the bag. The hepatic fossa was then reexamined and hemostasis was achieved with Bovie cautery and was excellent at this portion of the case. The subhepatic fossa and perihepatic fossa was then irrigated until the effluent was clear. The specimen bag and specimen were removed from the abdominal cavity.  The 11 mm trocar fascia was reapproximated with the Eulas [place] #1 Vicryl x1. The pneumoperitoneum was evacuated and all trochars removed under direct visulalization. The skin was then closed with 4-0 Monocryl and the skin dressed with Steri-Strips, gauze, and tape. The patient was awaken from general anesthesia and taken to the recovery room in stable condition.     PLAN OF CARE: Discharge to home after PACU  PATIENT DISPOSITION:  PACU - hemodynamically stable.   Delay start of Pharmacological VTE agent (>24hrs) due to surgical blood loss or risk of bleeding: not applicable

## 2016-10-05 NOTE — Anesthesia Preprocedure Evaluation (Signed)
Anesthesia Evaluation  Patient identified by MRN, date of birth, ID band Patient awake    Reviewed: Allergy & Precautions, H&P , Patient's Chart, lab work & pertinent test results, reviewed documented beta blocker date and time   Airway Mallampati: II  TM Distance: >3 FB Neck ROM: full    Dental no notable dental hx.    Pulmonary    Pulmonary exam normal breath sounds clear to auscultation       Cardiovascular  Rhythm:regular Rate:Normal     Neuro/Psych    GI/Hepatic   Endo/Other    Renal/GU      Musculoskeletal   Abdominal   Peds  Hematology   Anesthesia Other Findings   Reproductive/Obstetrics                             Anesthesia Physical Anesthesia Plan  ASA: II  Anesthesia Plan: General   Post-op Pain Management:    Induction: Intravenous  PONV Risk Score and Plan: 2 and 3 and Ondansetron, Dexamethasone and Propofol  Airway Management Planned: Oral ETT  Additional Equipment:   Intra-op Plan: Utilization Of Total Body Hypothermia per surgeon request  Post-operative Plan: Extubation in OR  Informed Consent: I have reviewed the patients History and Physical, chart, labs and discussed the procedure including the risks, benefits and alternatives for the proposed anesthesia with the patient or authorized representative who has indicated his/her understanding and acceptance.   Dental Advisory Given  Plan Discussed with: CRNA and Surgeon  Anesthesia Plan Comments: (  )        Anesthesia Quick Evaluation

## 2016-10-05 NOTE — Progress Notes (Signed)
Report given to mark rn as caregiver for lunch relief

## 2016-10-05 NOTE — Anesthesia Procedure Notes (Cosign Needed)
Procedure Name: Intubation Date/Time: 10/05/2016 10:06 AM Performed by: Lyndle Herrlich Pre-anesthesia Checklist: Patient identified Patient Re-evaluated:Patient Re-evaluated prior to induction Oxygen Delivery Method: Circle system utilized Preoxygenation: Pre-oxygenation with 100% oxygen Induction Type: IV induction Ventilation: Mask ventilation without difficulty Laryngoscope Size: Miller and 2 Grade View: Grade I Tube type: Oral Tube size: 7.0 mm Number of attempts: 1 Airway Equipment and Method: Stylet Placement Confirmation: ETT inserted through vocal cords under direct vision,  positive ETCO2 and breath sounds checked- equal and bilateral Secured at: 21 cm Tube secured with: Tape Dental Injury: Teeth and Oropharynx as per pre-operative assessment

## 2016-10-05 NOTE — Transfer of Care (Signed)
Immediate Anesthesia Transfer of Care Note  Patient: Karina Dean  Procedure(s) Performed: Procedure(s): LAPAROSCOPIC CHOLECYSTECTOMY (N/A)  Patient Location: PACU  Anesthesia Type:General  Level of Consciousness: awake, alert  and oriented  Airway & Oxygen Therapy: Patient Spontanous Breathing and Patient connected to nasal cannula oxygen  Post-op Assessment: Report given to RN  Post vital signs: Reviewed and stable  Last Vitals:  Vitals:   10/05/16 0743 10/05/16 1053  BP: 95/70 (P) 137/86  Pulse: 62 (P) 68  Resp: 18 (P) 18  Temp:  (!) (P) 36.4 C    Last Pain:  Vitals:   10/05/16 0736  TempSrc: Oral      Patients Stated Pain Goal: 3 (83/50/75 7322)  Complications: No apparent anesthesia complications

## 2016-10-05 NOTE — Discharge Instructions (Signed)
[facility] ______[facility] °LAPAROSCOPIC SURGERY: POST OP INSTRUCTIONS °Always review your discharge instruction sheet given to you by the facility where your surgery was performed. °IF YOU HAVE DISABILITY OR FAMILY LEAVE FORMS, YOU MUST BRING THEM TO THE OFFICE FOR PROCESSING.   °DO NOT GIVE THEM TO YOUR DOCTOR. ° °1. A prescription for pain medication may be given to you upon discharge.  Take your pain medication as prescribed, if needed.  If narcotic pain medicine is not needed, then you may take acetaminophen (Tylenol) or ibuprofen (Advil) as needed. °2. Take your usually prescribed medications unless otherwise directed. °3. If you need a refill on your pain medication, please contact your pharmacy.  They will contact our office to request authorization. Prescriptions will not be filled after 5pm or on week-ends. °4. You should follow a light diet the first few days after arrival home, such as soup and crackers, etc.  Be sure to include lots of fluids daily. °5. Most patients will experience some swelling and bruising in the area of the incisions.  Ice packs will help.  Swelling and bruising can take several days to resolve.  °6. It is common to experience some constipation if taking pain medication after surgery.  Increasing fluid intake and taking a stool softener (such as Colace) will usually help or prevent this problem from occurring.  A mild laxative (Milk of Magnesia or Miralax) should be taken according to package instructions if there are no bowel movements after 48 hours. °7. Unless discharge instructions indicate otherwise, you may remove your bandages 24-48 hours after surgery, and you may shower at that time.  You may have steri-strips (small skin tapes) in place directly over the incision.  These strips should be left on the skin for 7-10 days.  If your surgeon used skin glue on the incision, you may shower in 24 hours.  The glue will flake off over the next 2-3 weeks.  Any sutures or  staples will be removed at the office during your follow-up visit. °8. ACTIVITIES:  You may resume regular (light) daily activities beginning the next day--such as daily self-care, walking, climbing stairs--gradually increasing activities as tolerated.  You may have sexual intercourse when it is comfortable.  Refrain from any heavy lifting or straining until approved by your doctor. °a. You may drive when you are no longer taking prescription pain medication, you can comfortably wear a seatbelt, and you can safely maneuver your car and apply brakes. °b. RETURN TO WORK:  __________________________________________________________ °9. You should see your doctor in the office for a follow-up appointment approximately 2-3 weeks after your surgery.  Make sure that you call for this appointment within a day or two after you arrive home to insure a convenient appointment time. °10. OTHER INSTRUCTIONS: __________________________________________________________________________________________________________________________ __________________________________________________________________________________________________________________________ °WHEN TO CALL YOUR DOCTOR: °1. Fever over 101.0 °2. Inability to urinate °3. Continued bleeding from incision. °4. Increased pain, redness, or drainage from the incision. °5. Increasing abdominal pain ° °The clinic staff is available to answer your questions during regular business hours.  Please don’t hesitate to call and ask to speak to one of the nurses for clinical concerns.  If you have a medical emergency, go to the nearest emergency room or call 911.  A surgeon from [facility] is always on call at the hospital. °[address], [address], [place], [place]  [address] ? [address] [address], [place], [place]   [address] °[phone] ? [phone] ? FAX [phone] °Web site:   [email or web address] °

## 2016-10-06 ENCOUNTER — Encounter (HOSPITAL_COMMUNITY): Payer: Self-pay | Admitting: General Surgery

## 2016-10-06 NOTE — Anesthesia Postprocedure Evaluation (Signed)
Anesthesia Post Note  Patient: Karina Dean  Procedure(s) Performed: Procedure(s) (LRB): LAPAROSCOPIC CHOLECYSTECTOMY (N/A)     Patient location during evaluation: PACU Anesthesia Type: General Level of consciousness: awake and alert Pain management: pain level controlled Vital Signs Assessment: post-procedure vital signs reviewed and stable Respiratory status: spontaneous breathing, nonlabored ventilation, respiratory function stable and patient connected to nasal cannula oxygen Cardiovascular status: blood pressure returned to baseline and stable Postop Assessment: no signs of nausea or vomiting Anesthetic complications: no    Last Vitals:  Vitals:   10/05/16 1145 10/05/16 1155  BP: 113/73 105/90  Pulse: (!) 48 (!) 56  Resp: 15 14  Temp: 36.5 C     Last Pain:  Vitals:   10/05/16 1155  TempSrc:   PainSc: 0-No pain                 Ivey Cina EDWARD

## 2016-11-22 ENCOUNTER — Other Ambulatory Visit: Payer: Self-pay | Admitting: Family Medicine

## 2016-11-22 DIAGNOSIS — Z1231 Encounter for screening mammogram for malignant neoplasm of breast: Secondary | ICD-10-CM

## 2017-01-01 ENCOUNTER — Ambulatory Visit: Payer: Self-pay

## 2017-01-02 ENCOUNTER — Ambulatory Visit
Admission: RE | Admit: 2017-01-02 | Discharge: 2017-01-02 | Disposition: A | Discharge status: Home / Self Care | Payer: 59 | Source: Ambulatory Visit | Attending: Family Medicine | Admitting: Family Medicine

## 2017-01-02 DIAGNOSIS — Z1231 Encounter for screening mammogram for malignant neoplasm of breast: Secondary | ICD-10-CM

## 2017-08-19 IMAGING — US US ABDOMEN LIMITED
1 series · 14 of 25 positions shown · non-contrast
Comparison: None.

CLINICAL DATA: Acute onset of elevated LFTs and epigastric
abdominal pain. Initial encounter.

EXAM:
ULTRASOUND ABDOMEN LIMITED RIGHT UPPER QUADRANT

[Series 1: us abdomen limited · 0.26mm/px · 14 of 43 slices shown]
[im 1/43]
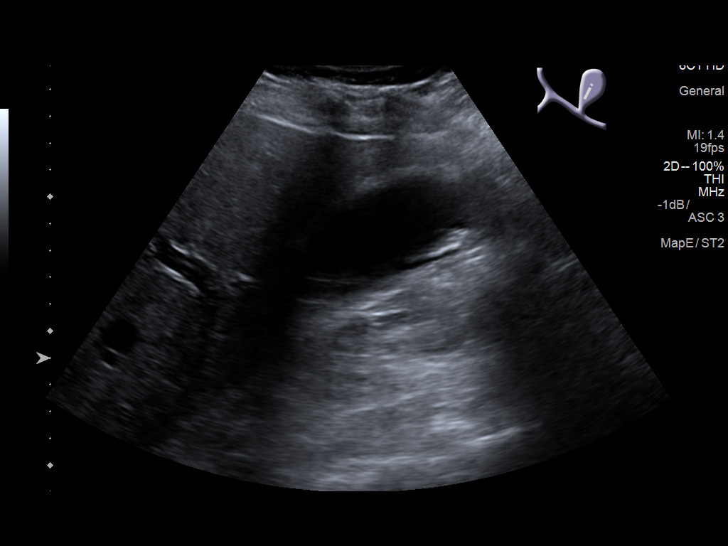
[im 4/43]
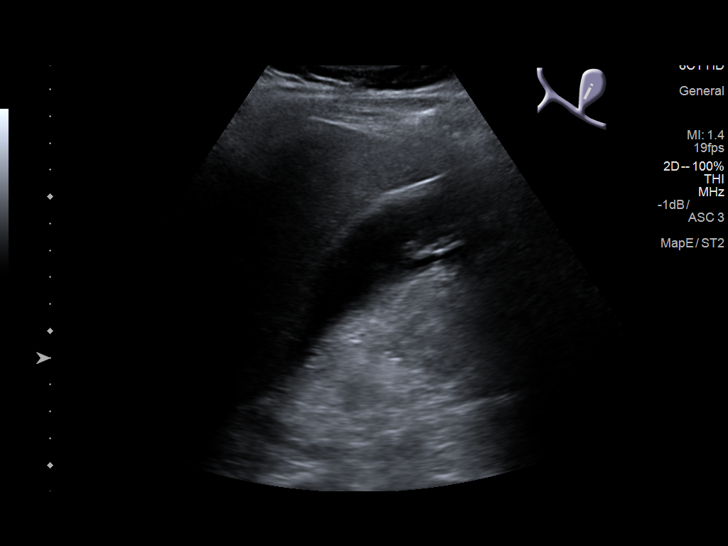
[im 8/43]
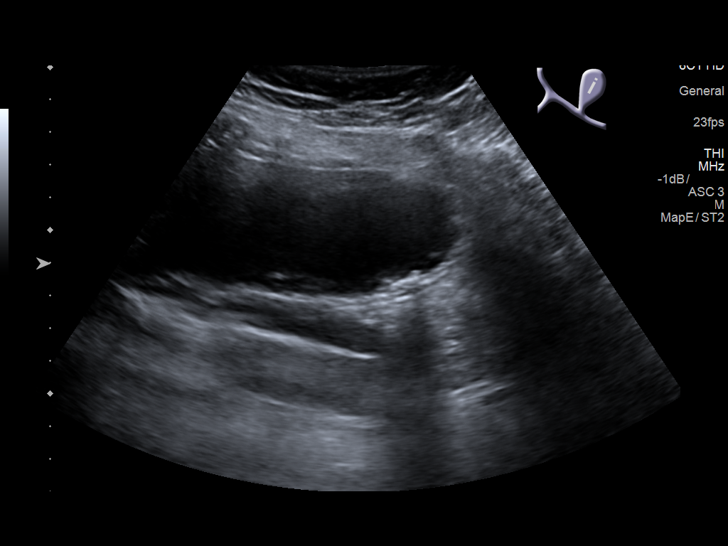
[im 11/43]
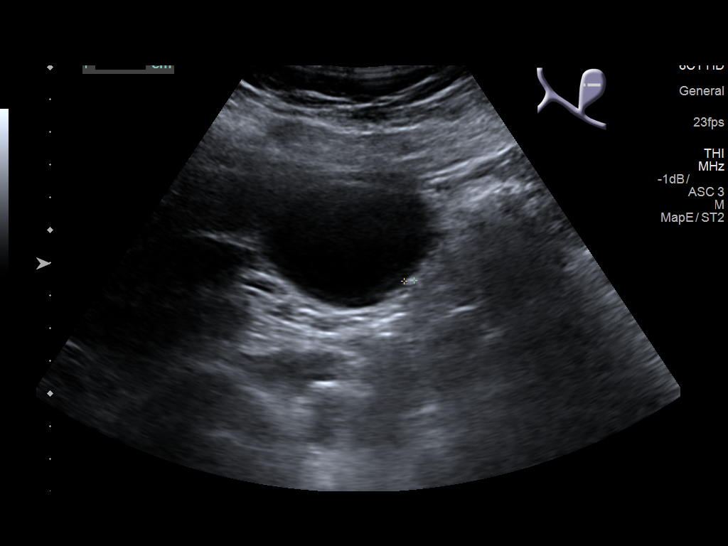
[im 15/43]
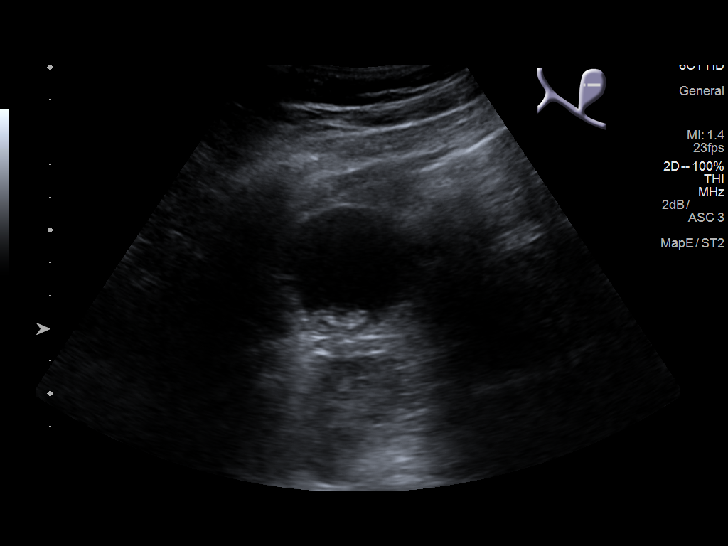
[im 16/43]
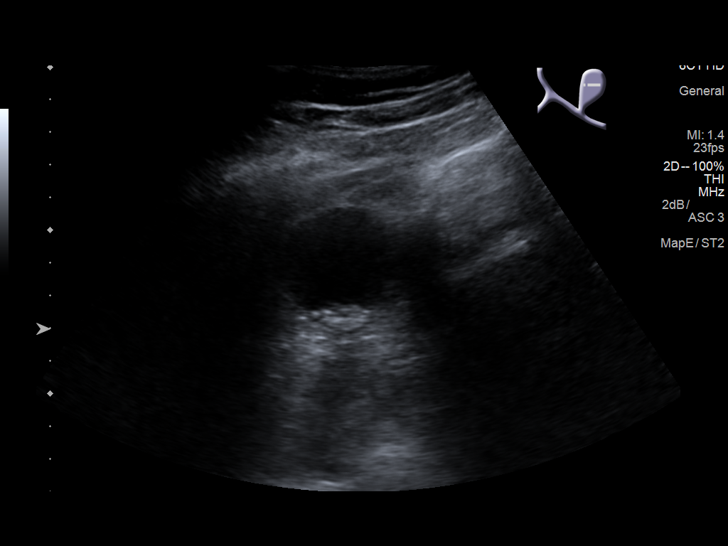
[im 20/43]
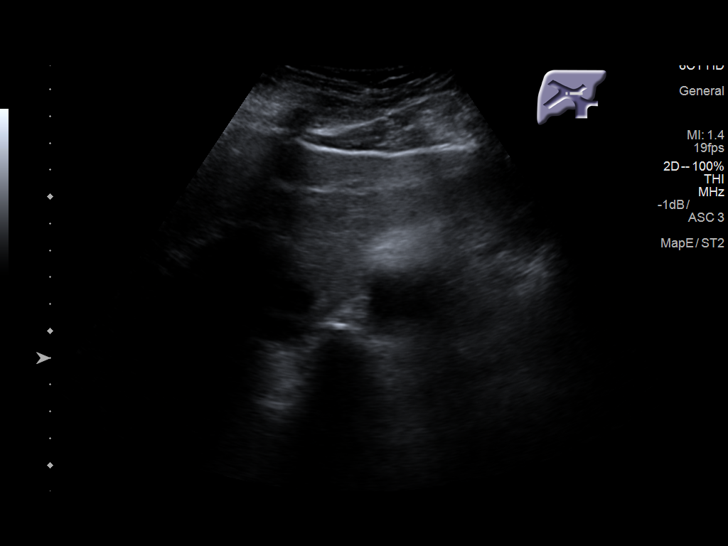
[im 23/43]
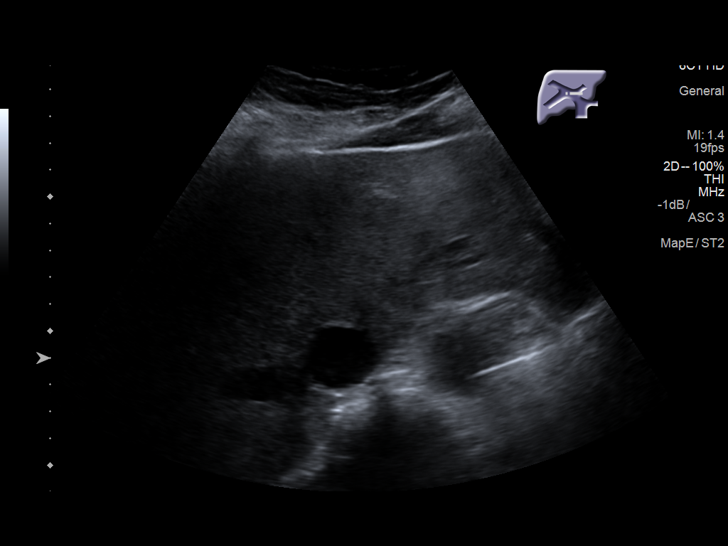
[im 27/43]
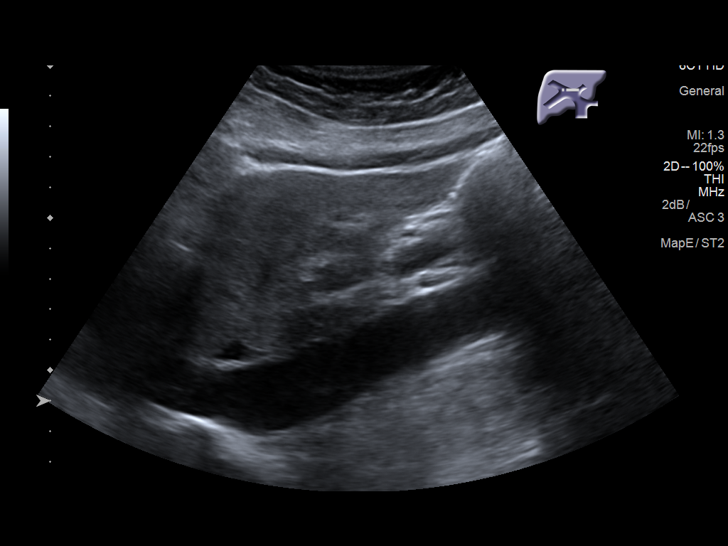
[im 29/43]
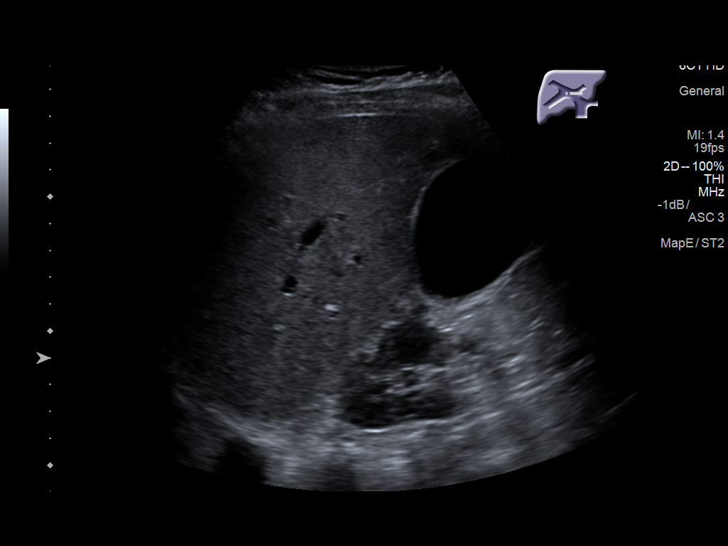
[im 32/43]
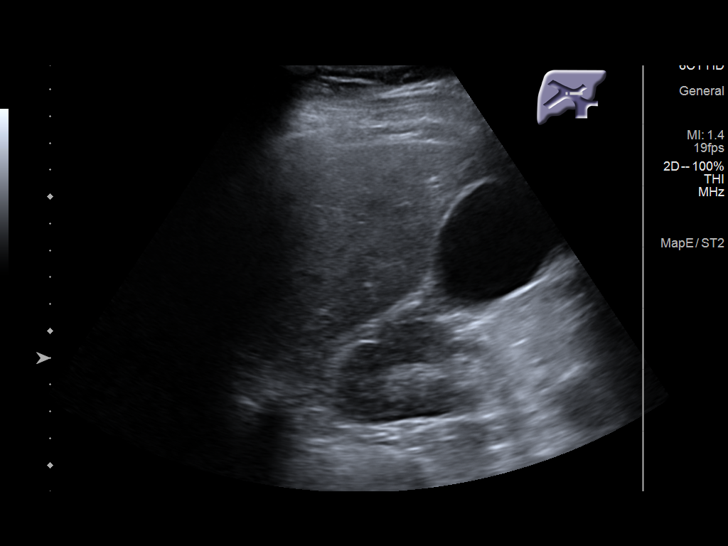
[im 36/43]
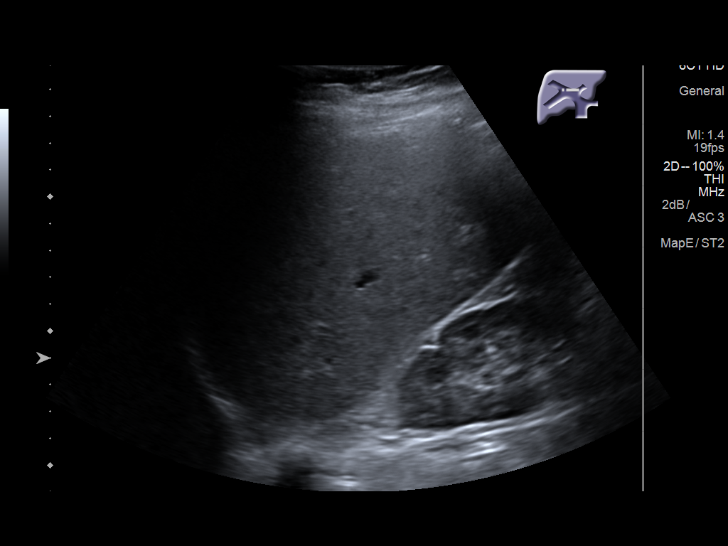
[im 39/43]
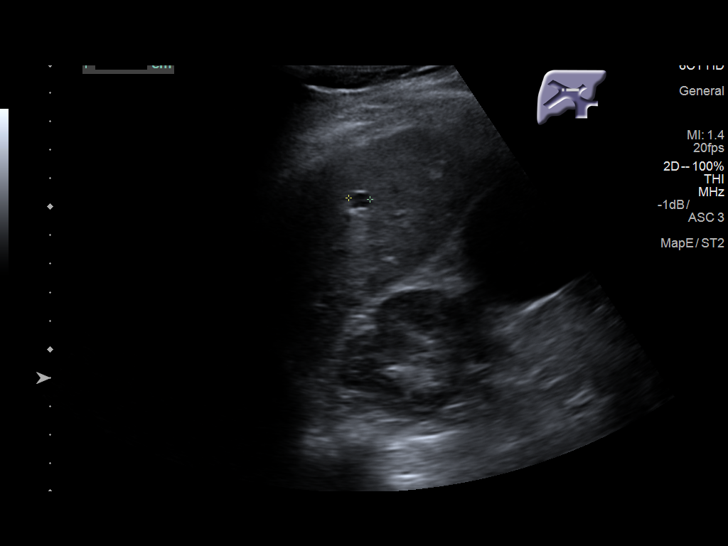
[im 43/43]
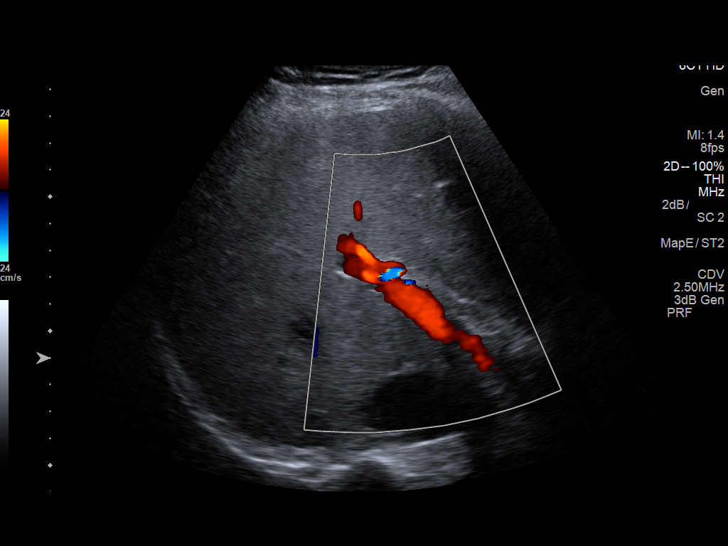

[14 of 25 positions shown; findings below may reference images not displayed]

FINDINGS: Gallbladder:

Stones within the gallbladder measure up to 3 mm in size. No
gallbladder wall thickening or pericholecystic fluid is seen. No
ultrasonographic Murphy's sign is elicited.

Common bile duct:

Diameter: 0.4 cm, within normal limits in caliber.

Liver:

A small 0.8 cm cyst is noted at the right hepatic lobe. Within
normal limits in parenchymal echogenicity.
IMPRESSION: 1. No acute abnormality seen at the right upper quadrant.
2. Cholelithiasis.  Gallbladder otherwise unremarkable.
3. Small 0.8 cm hepatic cyst noted.

## 2019-02-11 IMAGING — DX DG CHEST 2V
2 series · 2 of 2 positions shown · non-contrast
Comparison: None.

CLINICAL DATA: Chest pain and shortness of breath

EXAM:
CHEST  2 VIEW

[chest pa]
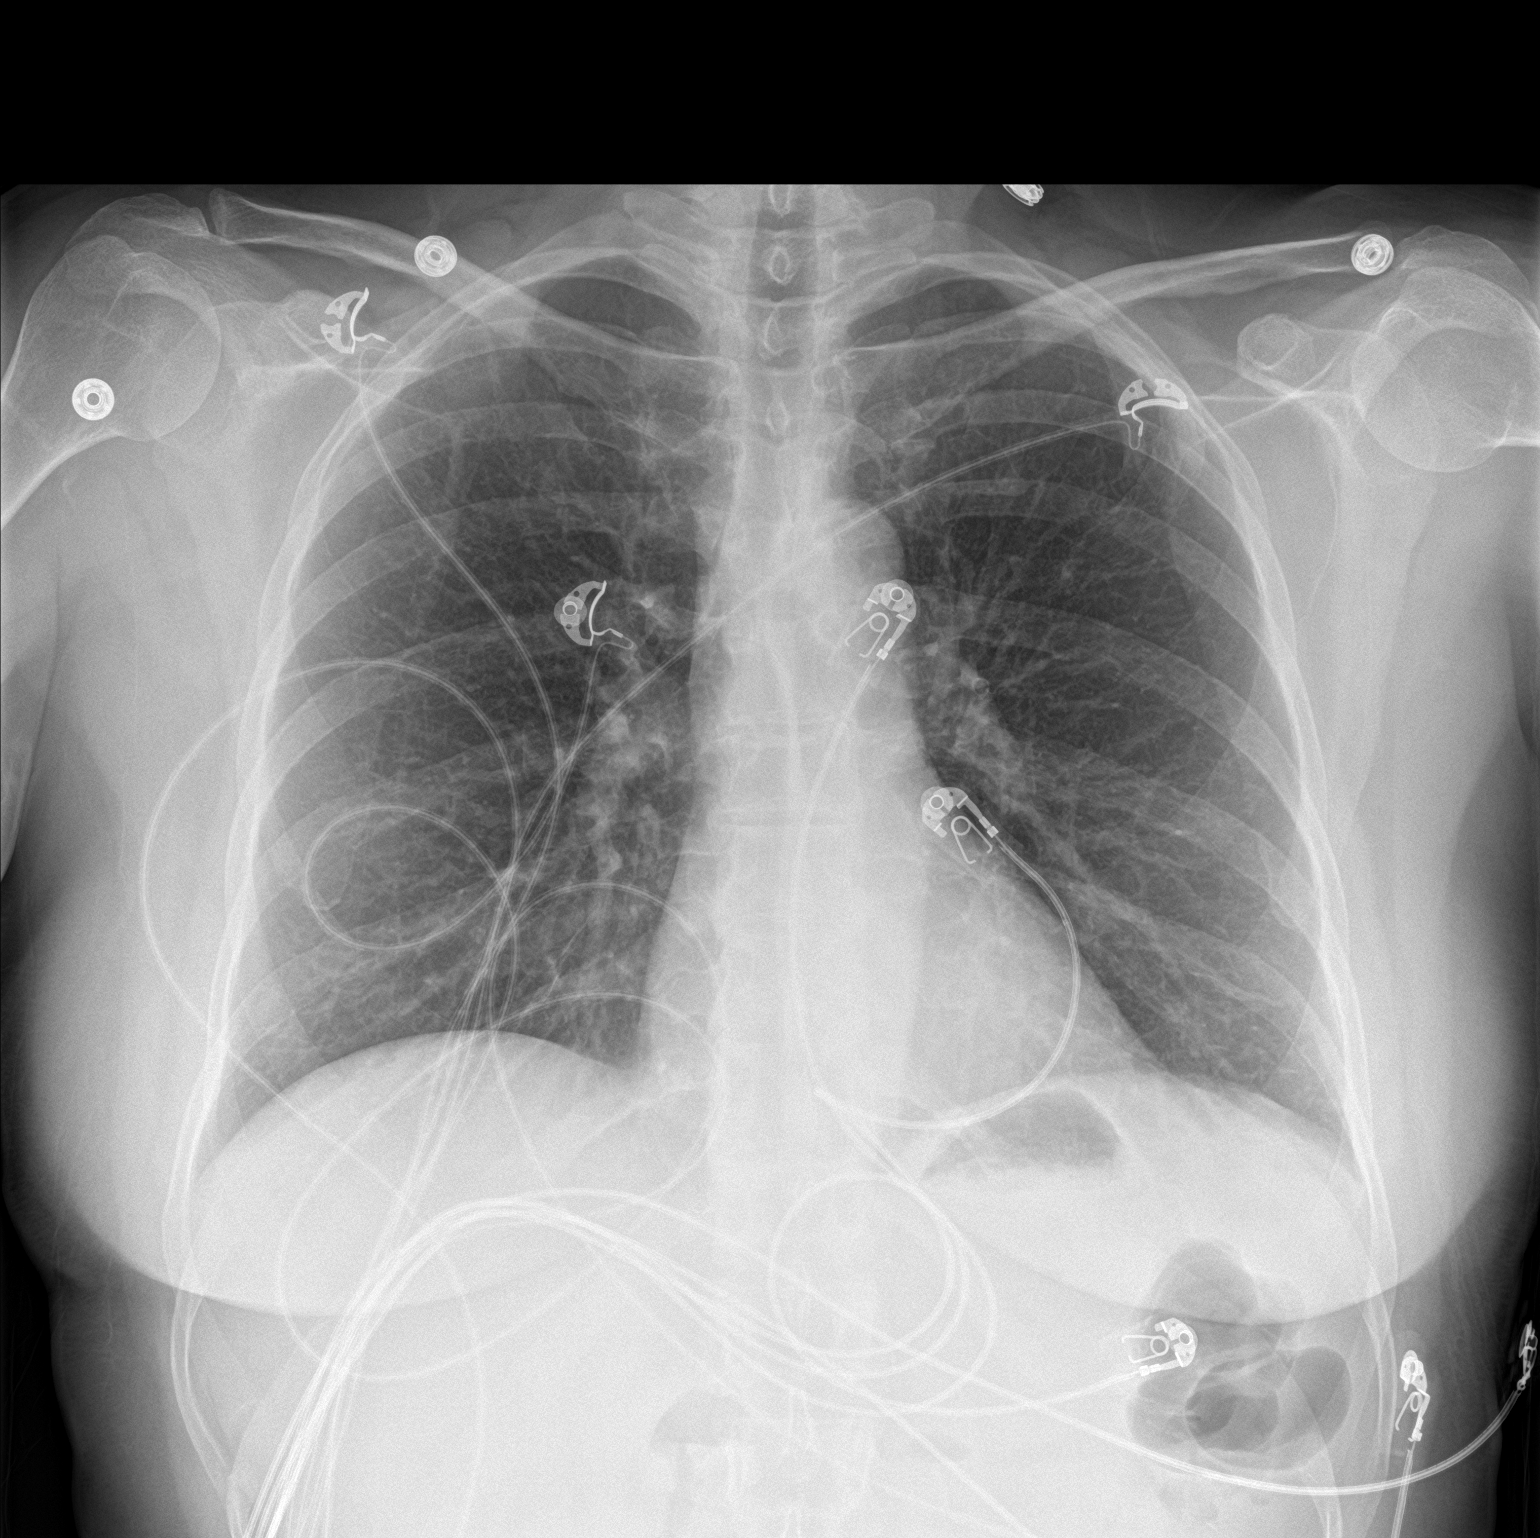

[chest lat]
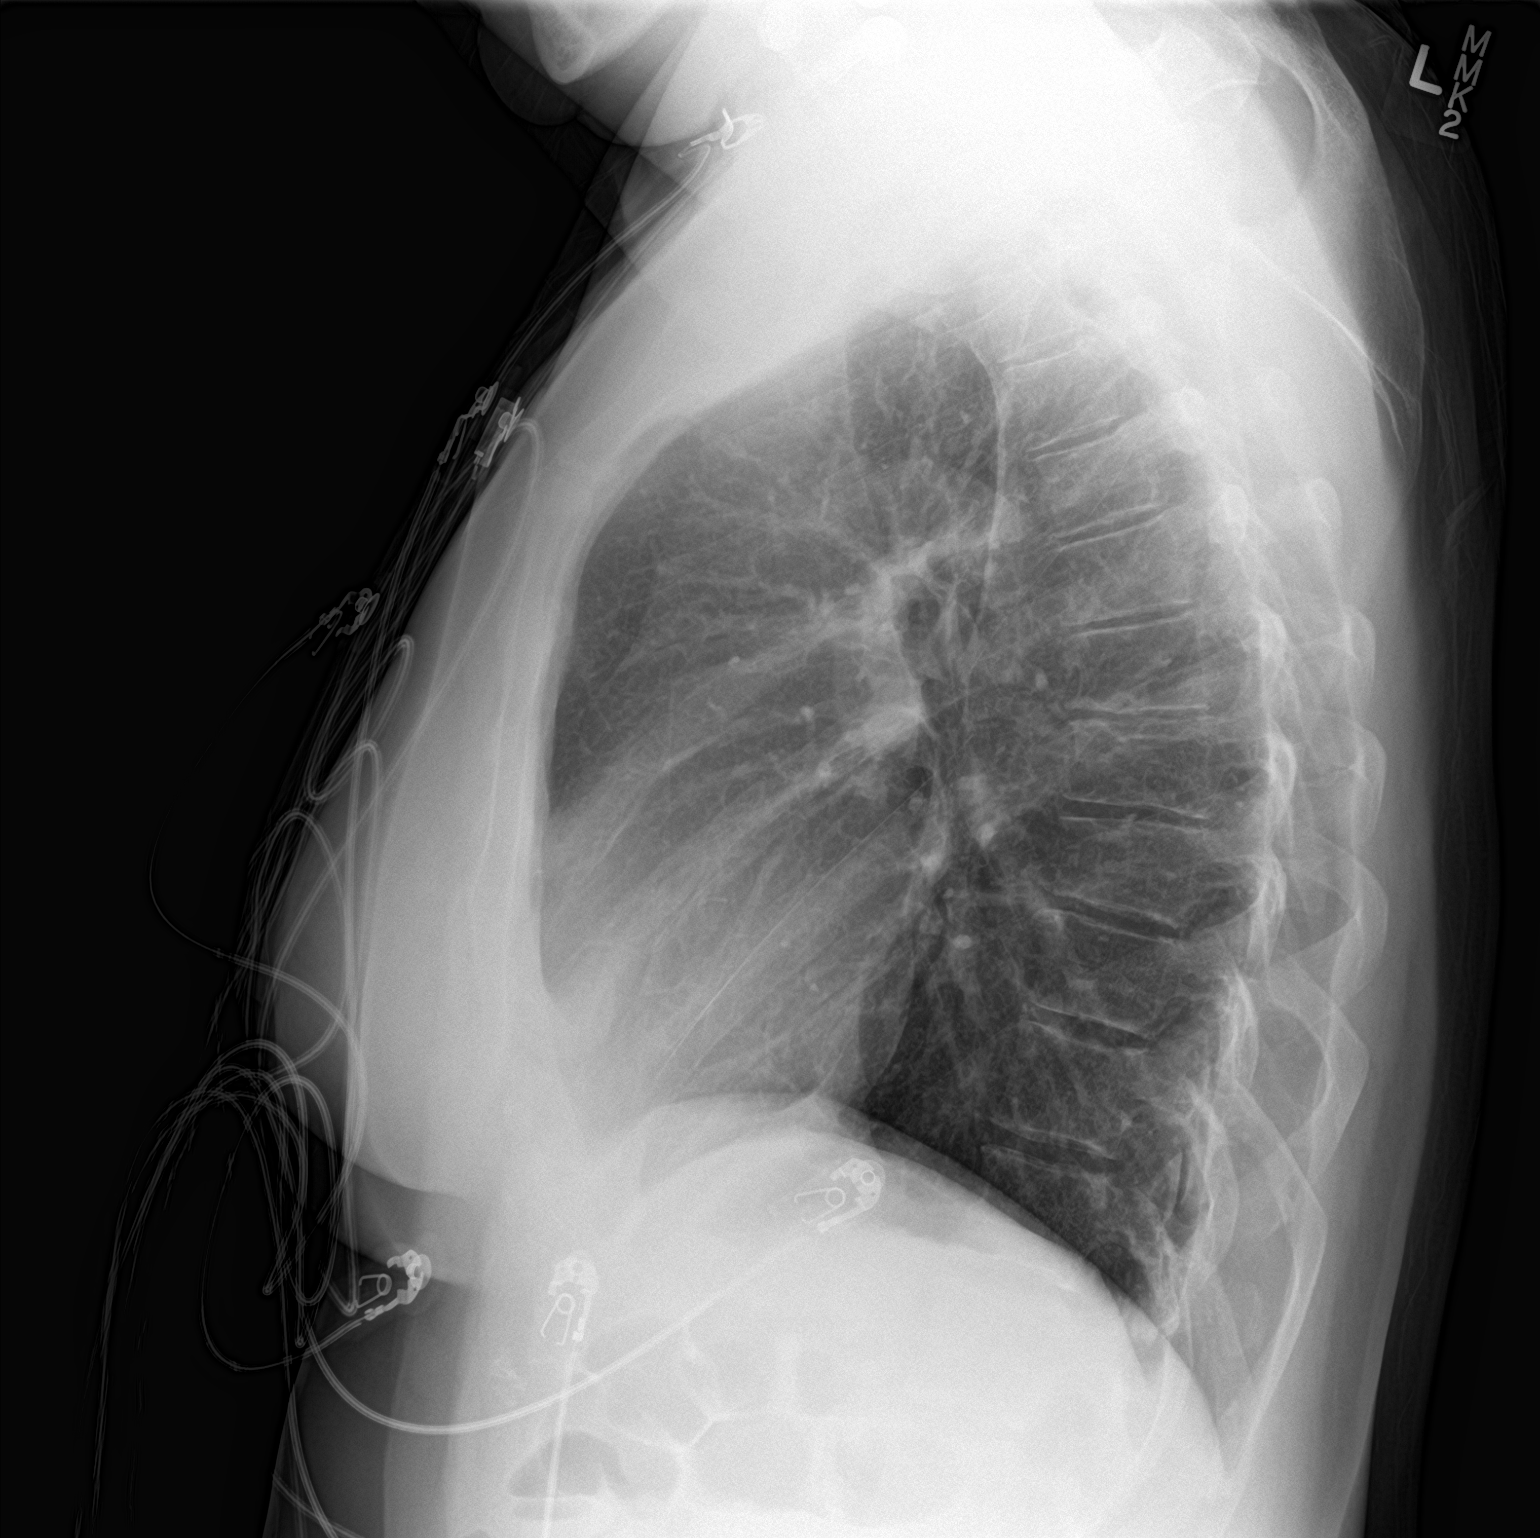

[2 of 2 positions shown; findings below may reference images not displayed]

FINDINGS: The heart size and mediastinal contours are within normal limits.
Both lungs are clear. The visualized skeletal structures are
unremarkable.
IMPRESSION: No active cardiopulmonary disease.
